# Patient Record
Sex: Female | Born: 2000
Health system: Southern US, Community
[De-identification: ages and names within clinical notes are randomized; demographics above are authoritative.]

## PROBLEM LIST (undated history)

## (undated) ENCOUNTER — Inpatient Hospital Stay (HOSPITAL_COMMUNITY): Payer: Self-pay

## (undated) DIAGNOSIS — D649 Anemia, unspecified: Secondary | ICD-10-CM

## (undated) DIAGNOSIS — E119 Type 2 diabetes mellitus without complications: Secondary | ICD-10-CM

---
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
## 2018-01-05 ENCOUNTER — Emergency Department (HOSPITAL_COMMUNITY)
Admission: EM | Admit: 2018-01-05 | Discharge: 2018-01-05 | Disposition: A | Discharge status: Home / Self Care | Payer: Medicaid Other | Attending: Emergency Medicine | Admitting: Emergency Medicine

## 2018-01-05 ENCOUNTER — Encounter (HOSPITAL_COMMUNITY): Payer: Self-pay | Admitting: *Deleted

## 2018-01-05 ENCOUNTER — Other Ambulatory Visit: Payer: Self-pay

## 2018-01-05 DIAGNOSIS — R1012 Left upper quadrant pain: Secondary | ICD-10-CM | POA: Insufficient documentation

## 2018-01-05 DIAGNOSIS — Z7722 Contact with and (suspected) exposure to environmental tobacco smoke (acute) (chronic): Secondary | ICD-10-CM | POA: Diagnosis not present

## 2018-01-05 DIAGNOSIS — Z79899 Other long term (current) drug therapy: Secondary | ICD-10-CM | POA: Diagnosis not present

## 2018-01-05 DIAGNOSIS — E119 Type 2 diabetes mellitus without complications: Secondary | ICD-10-CM | POA: Insufficient documentation

## 2018-01-05 DIAGNOSIS — Z794 Long term (current) use of insulin: Secondary | ICD-10-CM | POA: Insufficient documentation

## 2018-01-05 LAB — CBC WITH DIFFERENTIAL/PLATELET
Abs Immature Granulocytes: 0 10*3/uL (ref 0.0–0.1)
Basophils Absolute: 0 10*3/uL (ref 0.0–0.1)
Basophils Relative: 0 %
EOS PCT: 3 %
Eosinophils Absolute: 0.2 10*3/uL (ref 0.0–0.7)
HEMATOCRIT: 39 % (ref 36.0–46.0)
HEMOGLOBIN: 12 g/dL (ref 12.0–15.0)
Immature Granulocytes: 0 %
LYMPHS ABS: 2.2 10*3/uL (ref 0.7–4.0)
LYMPHS PCT: 33 %
MCH: 26.1 pg (ref 26.0–34.0)
MCHC: 30.8 g/dL (ref 30.0–36.0)
MCV: 84.8 fL (ref 78.0–100.0)
Monocytes Absolute: 0.5 10*3/uL (ref 0.1–1.0)
Monocytes Relative: 7 %
Neutro Abs: 3.8 10*3/uL (ref 1.7–7.7)
Neutrophils Relative %: 57 %
Platelets: 324 10*3/uL (ref 150–400)
RBC: 4.6 MIL/uL (ref 3.87–5.11)
RDW: 15.6 % — ABNORMAL HIGH (ref 11.5–15.5)
WBC: 6.7 10*3/uL (ref 4.0–10.5)

## 2018-01-05 LAB — COMPREHENSIVE METABOLIC PANEL
ALBUMIN: 3.5 g/dL (ref 3.5–5.0)
ALK PHOS: 61 U/L (ref 38–126)
ALT: 11 U/L (ref 0–44)
AST: 14 U/L — AB (ref 15–41)
Anion gap: 8 (ref 5–15)
BILIRUBIN TOTAL: 0.4 mg/dL (ref 0.3–1.2)
BUN: 10 mg/dL (ref 6–20)
CO2: 24 mmol/L (ref 22–32)
CREATININE: 0.58 mg/dL (ref 0.44–1.00)
Calcium: 9.2 mg/dL (ref 8.9–10.3)
Chloride: 105 mmol/L (ref 98–111)
GFR calc Af Amer: >60 mL/min (ref >60)
GFR calc non Af Amer: >60 mL/min (ref >60)
GLUCOSE: 166 mg/dL — AB (ref 70–99)
Potassium: 3.3 mmol/L — ABNORMAL LOW (ref 3.5–5.1)
Sodium: 137 mmol/L (ref 135–145)
TOTAL PROTEIN: 6.5 g/dL (ref 6.5–8.1)

## 2018-01-05 LAB — LIPASE, BLOOD: Lipase: 35 U/L (ref 11–51)

## 2018-01-05 MED ORDER — GI COCKTAIL ~~LOC~~
30.0000 mL | Freq: Once | ORAL | Status: AC
  Administered 2018-01-05: 30 mL via ORAL
  Filled 2018-01-05: qty 30

## 2018-01-05 MED ORDER — METOCLOPRAMIDE HCL 10 MG PO TABS: 10.0000 mg | ORAL_TABLET | Freq: Three times a day (TID) | ORAL | 0 refills | Status: AC | PRN

## 2018-01-05 MED ORDER — METOCLOPRAMIDE HCL 10 MG PO TABS
10.0000 mg | ORAL_TABLET | Freq: Once | ORAL | Status: AC
  Administered 2018-01-05: 10 mg via ORAL
  Filled 2018-01-05: qty 1

## 2018-01-05 MED ORDER — METOCLOPRAMIDE HCL 5 MG/ML IJ SOLN: 10.0000 mg | Freq: Once | INTRAMUSCULAR | Status: DC

## 2018-01-05 NOTE — ED Triage Notes (Signed)
The pt arrived  By [facility] from home  C/o lt flank pain since august  She delivered in august she has been seen in the [place] for the same.  She was last seen one week ago for the same  Diabetic with an insulin pump  lmp  Delivered in august

## 2018-01-05 NOTE — Discharge Instructions (Signed)

## 2018-01-05 NOTE — ED Provider Notes (Signed)
[facility] EMERGENCY DEPARTMENT Provider Note  CSN: [id number] Arrival date & time: 01/05/18 8657  Chief Complaint(s) Abdominal Pain  HPI Jasmine Baker is a 17 y.o. female   The history is provided by the patient.  Abdominal Pain   Episode onset: 4 weeks. The problem occurs constantly. Progression since onset: fluctuating. The pain is associated with an unknown factor. The pain is located in the LUQ. The quality of the pain is sharp. Pain severity now: moderate to severe. Associated symptoms include nausea, constipation and headaches. Pertinent negatives include anorexia, fever, belching, diarrhea, flatus, hematochezia, melena and vomiting. The symptoms are aggravated by palpation and certain positions. Relieved by: prone position.    Past Medical History Past Medical History:  Diagnosis Date  . Anemia   . Diabetes mellitus without complication [facility])    Patient Active Problem List   Diagnosis Date Noted  . Uncontrolled diabetes mellitus (HCC) 11/11/2017  . Supervision of high risk pregnancy, antepartum 05/12/2017  . Pre-existing type 1 diabetes mellitus during pregnancy in first trimester 05/12/2017  . Hypovitaminosis D 02/06/2017  . Maladaptive health behaviors affecting medical condition 01/29/2017  . 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 Depression   . Non compliance with medical treatment   . Hyperglycemia due to type 1 diabetes mellitus (HCC) 02/12/2016   Home Medication(s) Prior to Admission medications   Medication Sig Start Date End Date Taking? Authorizing Provider  alum & mag hydroxide-simeth (MAALOX ADVANCED MAX ST) 400-400-40 MG/5ML suspension Take 10 mLs by mouth every 6 (six) hours as needed for indigestion. 12/27/17  Yes Dartha Lodge, PA-C  dicyclomine (BENTYL) 20 MG tablet Take 1 tablet (20 mg total) by mouth 2 (two) times daily. 12/27/17  Yes Dartha Lodge, PA-C  famotidine (PEPCID) 20 MG tablet Take 1 tablet (20 mg total) by mouth 2  (two) times daily. 12/27/17  Yes Dartha Lodge, PA-C  ferrous sulfate 325 (65 FE) MG tablet Take 1 tablet (325 mg total) by mouth 2 (two) times daily with a meal. 11/16/17  Yes Montez Morita, CNM  glucagon 1 MG injection Use for Severe Hypoglycemia . Inject 1 mg intramuscularly if unresponsive, unable to swallow, unconscious and/or has seizure 07/30/17  Yes Carlus Pavlov, MD  Insulin Human (INSULIN PUMP) SOLN Inject into the skin continuous. Novolog   Yes [provider]  ondansetron (ZOFRAN ODT) 4 MG disintegrating tablet 4mg  ODT q4 hours prn nausea/vomit Patient taking differently: Take 4 mg by mouth every 4 (four) hours as needed for nausea.  12/27/17  Yes Ford, Arva Chafe, PA-C  ACCU-CHEK FASTCLIX LANCETS MISC 1 each by Does not apply route as directed. Check sugar 6 x daily 04/25/16   Dessa Phi, MD  glucose blood (ACCU-CHEK GUIDE) test strip Use as instructed for 6 checks per day plus per protocol for hyper/hypoglycemia 06/11/17   Hermina Staggers, MD  ibuprofen (ADVIL,MOTRIN) 600 MG tablet Take 1 tablet (600 mg total) by mouth every 6 (six) hours. Patient not taking: Reported on 01/05/2018 11/16/17   Montez Morita, CNM  Insulin Pen Needle (INSUPEN PEN NEEDLES) 32G X 4 MM MISC BD Pen Needles- brand specific. Inject insulin via insulin pen 6 x daily 06/11/17   Hermina Staggers, MD  metoCLOPramide (REGLAN) 10 MG tablet Take 1 tablet (10 mg total) by mouth every 8 (eight) hours as needed for nausea. 01/05/18   Nira Conn, MD  Urine Glucose-Ketones Test STRP Use to check urine in cases of hyperglycemia  05/21/16   Gretchen Short, NP                                                                                                                                    Past Surgical History Past Surgical History:  Procedure Laterality Date  . WISDOM TOOTH EXTRACTION  09/12/2015   Family History Family History  Problem Relation Age of Onset  . Diabetes Mother   . Hypertension  Mother   . Hyperlipidemia Mother   . Kidney disease Mother   . Breast cancer Mother   . Diabetes Maternal Grandfather   . Schizophrenia Brother   . Dementia Maternal Grandmother   . Hypertension Father   . Diabetes Paternal Grandmother     Social History Social History   Tobacco Use  . Smoking status: Passive Smoke Exposure - Never Smoker  . Smokeless tobacco: Never Used  Substance Use Topics  . Alcohol use: No  . Drug use: No   Allergies Patient has no known allergies.  Review of Systems Review of Systems  Constitutional: Negative for fever.  Gastrointestinal: Positive for abdominal pain, constipation and nausea. Negative for anorexia, diarrhea, flatus, hematochezia, melena and vomiting.  Neurological: Positive for headaches.   All other systems are reviewed and are negative for acute change except as noted in the HPI  Physical Exam Vital Signs  I have reviewed the triage vital signs BP (!) 150/93   Pulse (!) 53   Temp 98 F (36.7 C) (Oral)   Resp 20   Ht 5\' 2"  (1.575 m)   Wt 63.5 kg   SpO2 100%   BMI 25.60 kg/m   Physical Exam  Constitutional: She is oriented to person, place, and time. She appears well-developed and well-nourished. No distress.  HENT:  Head: Normocephalic and atraumatic.  Right Ear: External ear normal.  Left Ear: External ear normal.  Nose: Nose normal.  Eyes: Conjunctivae and EOM are normal. No scleral icterus.  Neck: Normal range of motion and phonation normal.  Cardiovascular: Normal rate and regular rhythm.  Pulmonary/Chest: Effort normal. No stridor. No respiratory distress.  Abdominal: She exhibits no distension. There is tenderness in the left upper quadrant. There is no rigidity, no rebound and no guarding.  Musculoskeletal: Normal range of motion. She exhibits no edema.  Neurological: She is alert and oriented to person, place, and time.  Skin: She is not diaphoretic.  Psychiatric: She has a normal mood and affect. Her  behavior is normal.  Vitals reviewed.   ED Results and Treatments Labs (all labs ordered are listed, but only abnormal results are displayed) Labs Reviewed  CBC WITH DIFFERENTIAL/PLATELET - Abnormal; Notable for the following components:      Result Value   RDW 15.6 (*)    All other components within normal limits  COMPREHENSIVE METABOLIC PANEL - Abnormal; Notable for the following components:   Potassium 3.3 (*)    Glucose, Bld 166 (*)  AST 14 (*)    All other components within normal limits  LIPASE, BLOOD                                                                                                                         EKG  EKG Interpretation  Date/Time:    Ventricular Rate:    PR Interval:    QRS Duration:   QT Interval:    QTC Calculation:   R Axis:     Text Interpretation:        Radiology No results found. Pertinent labs & imaging results that were available during my care of the patient were reviewed by me and considered in my medical decision making (see chart for details).  Medications Ordered in ED Medications  gi cocktail (Maalox,Lidocaine,Donnatal) (30 mLs Oral Given 01/05/18 0511)  metoCLOPramide (REGLAN) tablet 10 mg (10 mg Oral Given 01/05/18 0510)                                                                                                                                    Procedures Procedures  (including critical care time)  Medical Decision Making / ED Course I have reviewed the nursing notes for this encounter and the patient's prior records (if available in EHR or on provided paperwork).    Persistent left upper quadrant abdominal pain.  Labs grossly reassuring without leukocytosis or anemia.  No significant electrolyte derangements or renal insufficiency.  No evidence of biliary obstruction or pancreatitis.  Patient given Reglan and GI cocktail, resulting in moderate relief.  She is able to tolerate oral intake.  Low suspicion for  serious intra-abdominal inflammatory/infectious process requiring imaging at this time.  The patient appears reasonably screened and/or stabilized for discharge and I doubt any other medical condition or other [facility] requiring further screening, evaluation, or treatment in the ED at this time prior to discharge.  The patient is safe for discharge with strict return precautions.   Final Clinical Impression(s) / ED Diagnoses Final diagnoses:  LUQ pain    Disposition: Discharge  Condition: Good  I have discussed the results, Dx and Tx plan with the patient who expressed understanding and agree(s) with the plan. Discharge instructions discussed at great length. The patient was given strict return precautions who verbalized understanding of the instructions. No further questions at time of discharge.    ED Discharge Orders  Ordered    metoCLOPramide (REGLAN) 10 MG tablet  Every 8 hours PRN     01/05/18 0733           Follow Up: Primary care provider   If you do not have a primary care physician, contact [facility] at [phone] for referral     This chart was dictated using voice recognition software.  Despite best efforts to proofread,  errors can occur which can change the documentation meaning.   Nira Conn, MD 01/05/18 [phone]

## 2018-01-05 NOTE — ED Notes (Signed)
The pt does not want an iv  edp changed order for po meds

## 2018-10-22 IMAGING — CT CT ABD-PELV W/ CM
2 of 4 series · 16 of 46 positions shown, 18 images · IV contrast (ISOVUE)
Comparison: None.

CLINICAL DATA: Generalized abdominal pain with diarrhea

EXAM:
CT ABDOMEN AND PELVIS WITH CONTRAST
TECHNIQUE: Multidetector CT imaging of the abdomen and pelvis was performed
using the standard protocol following bolus administration of
intravenous contrast.
CONTRAST:  80mL S8WPZ4-2MM IOPAMIDOL (S8WPZ4-2MM) INJECTION 61%

[Series 2: axial st · axial · 0.61mm/px · z∈[-506,-131]mm · 13 of 86 slices shown, 15 images]
[im 6/86  soft-tissue]
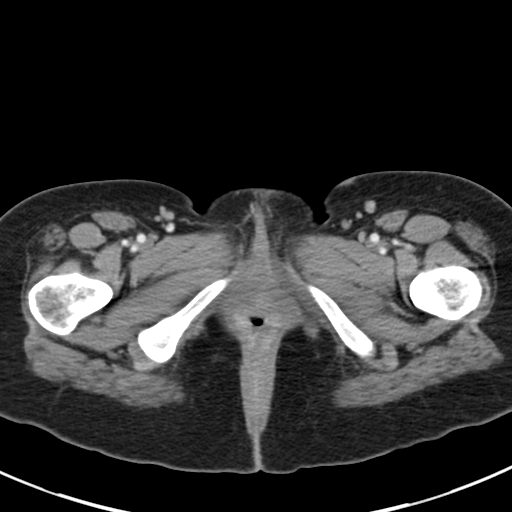
[im 6/86  bone]
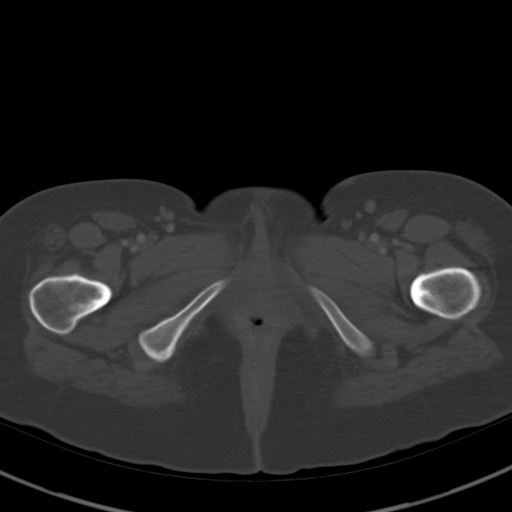
[im 11/86  soft-tissue]
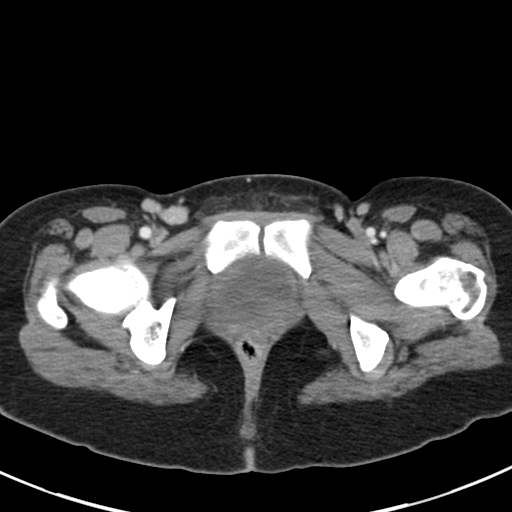
[im 21/86  soft-tissue]
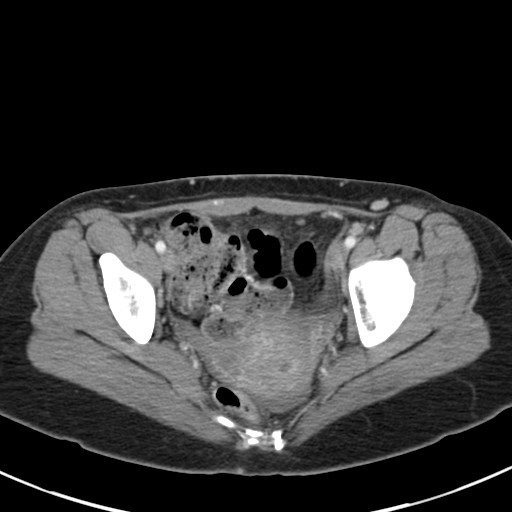
[im 26/86  soft-tissue]
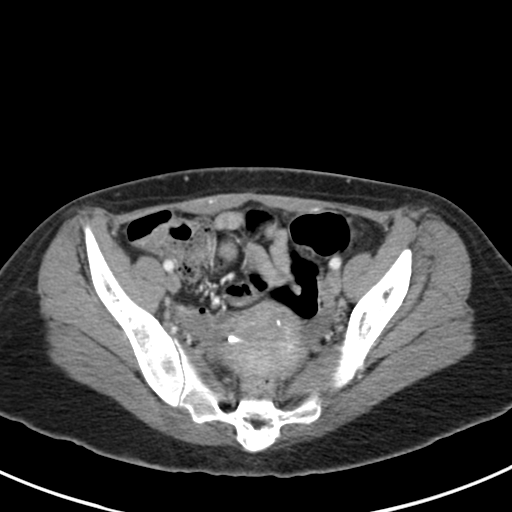
[im 31/86  soft-tissue]
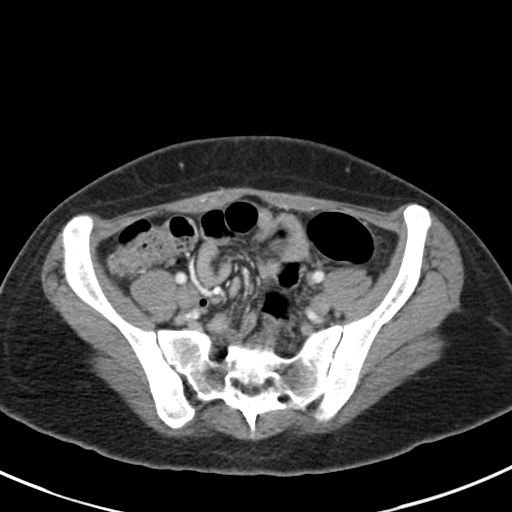
[im 36/86  soft-tissue]
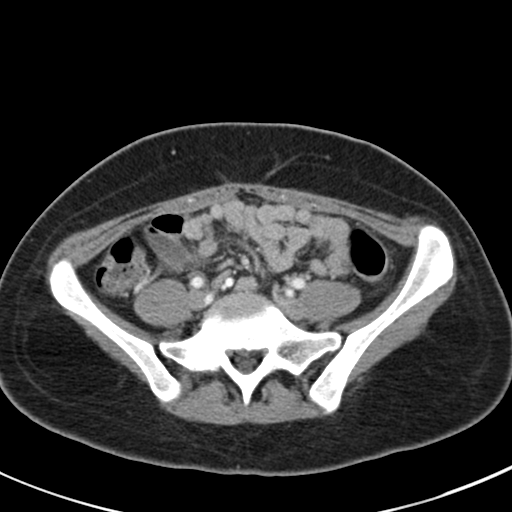
[im 46/86  soft-tissue]
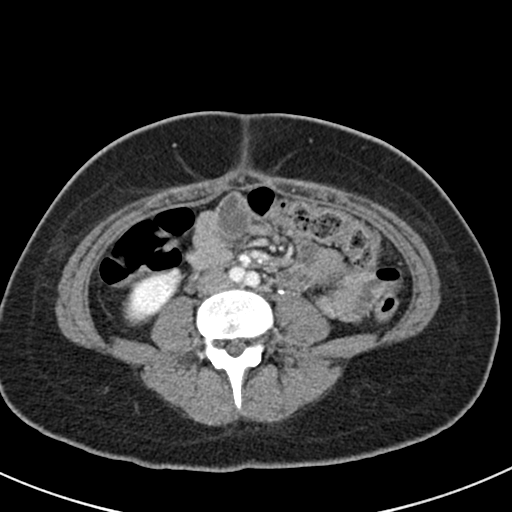
[im 51/86  soft-tissue]
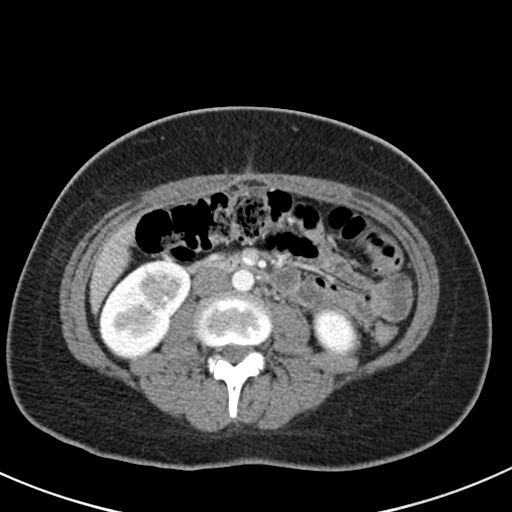
[im 56/86  soft-tissue]
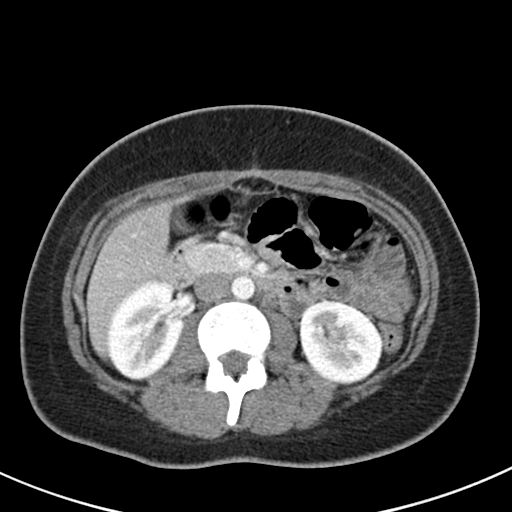
[im 56/86  bone]
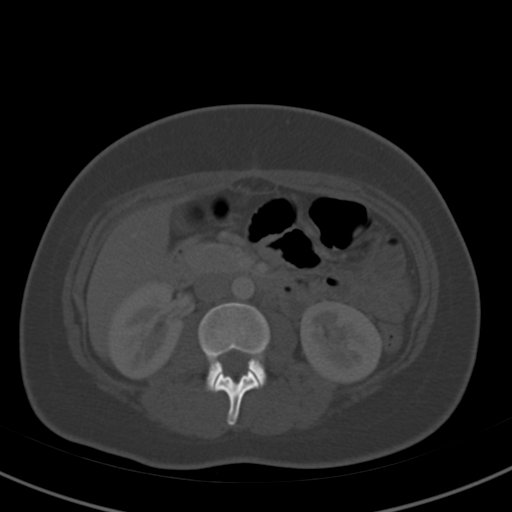
[im 61/86  soft-tissue]
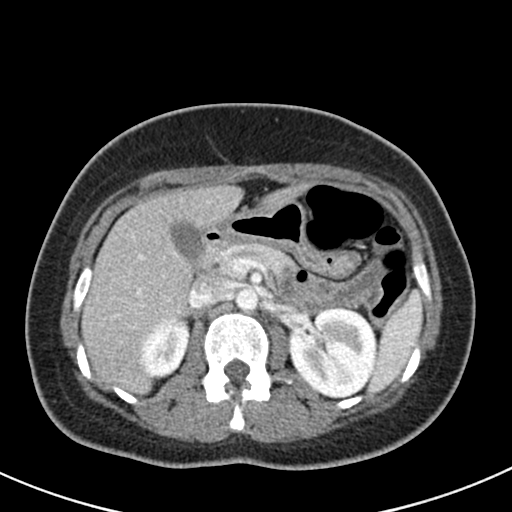
[im 66/86  soft-tissue]
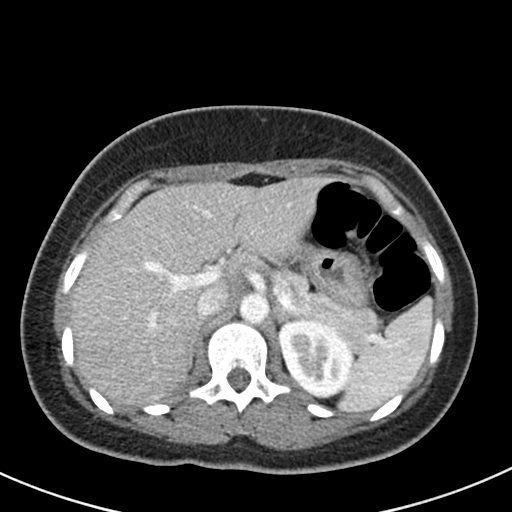
[im 76/86  soft-tissue]
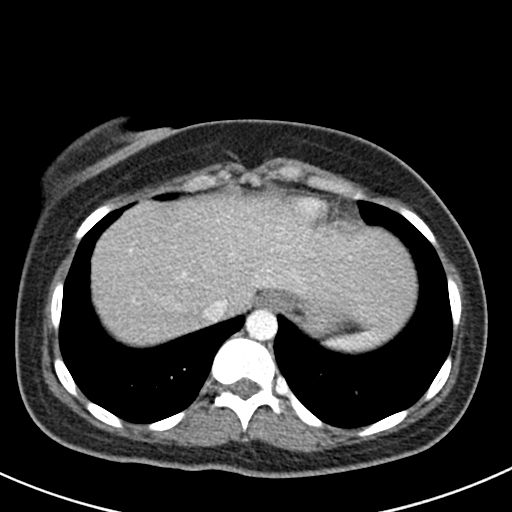
[im 81/86  soft-tissue]
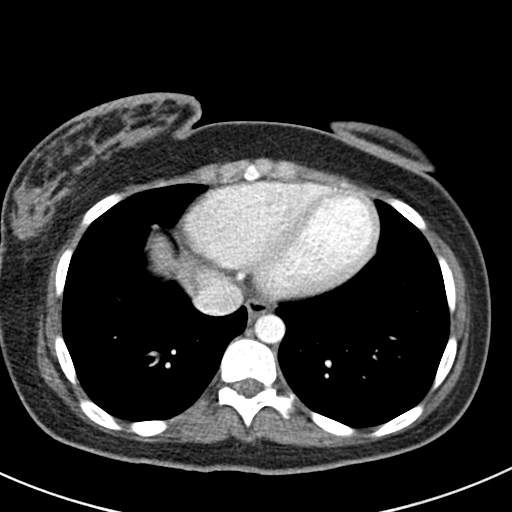

[Series 4: coronal st · coronal · 0.57mm/px · 3 of 65 slices shown]
[im 22/65  soft-tissue]
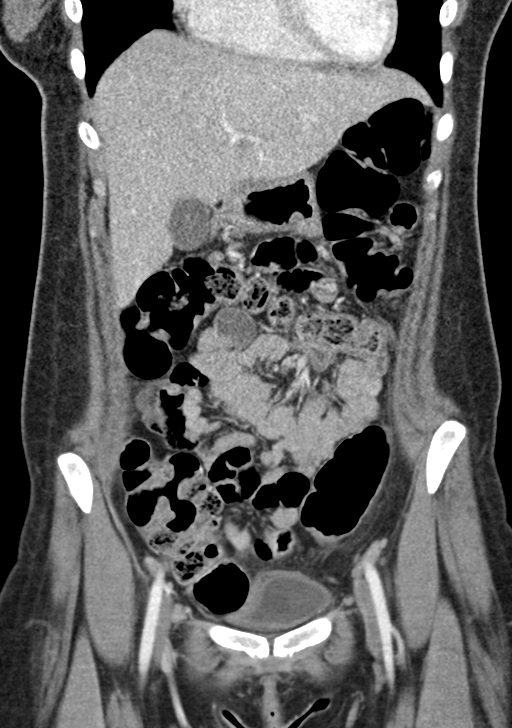
[im 29/65  soft-tissue]
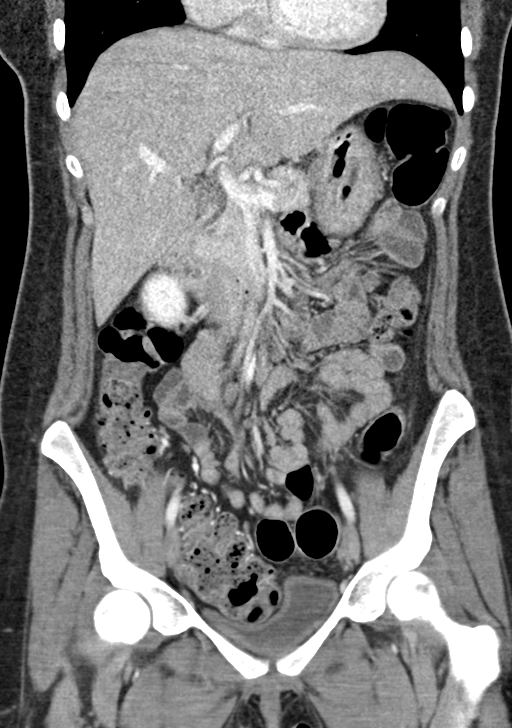
[im 36/65  soft-tissue]
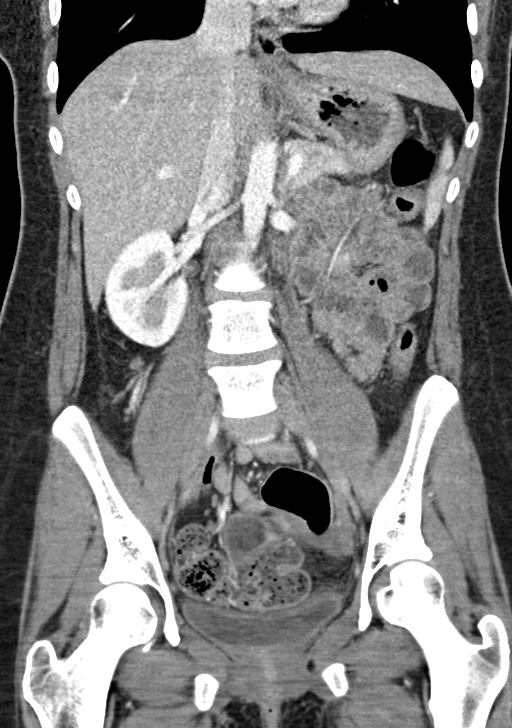

[16 of 46 positions shown; findings below may reference images not displayed]

FINDINGS: Lower chest: No acute abnormality.

Hepatobiliary: No focal liver abnormality is seen. No gallstones,
gallbladder wall thickening, or biliary dilatation.

Pancreas: Unremarkable. No pancreatic ductal dilatation or
surrounding inflammatory changes.

Spleen: Normal in size without focal abnormality.

Adrenals/Urinary Tract: Adrenal glands are unremarkable. Kidneys are
normal, without renal calculi, focal lesion, or hydronephrosis.
Bladder is unremarkable.

Stomach/Bowel: Stomach is within normal limits. Appendix appears
normal. No evidence of bowel wall thickening, distention, or
inflammatory changes.

Vascular/Lymphatic: No significant vascular findings are present. No
enlarged abdominal or pelvic lymph nodes. Prominent right inguinal
lymph nodes measuring up to 11 mm in size.

Reproductive: Intrauterine device in the uterus.  No adnexal mass.

Other: Trace free fluid in the pelvis.  No free air.

Musculoskeletal: No acute or significant osseous findings.
IMPRESSION: 1. No CT evidence for acute intra-abdominal or pelvic abnormality.
2. Trace free fluid in the pelvis.

## 2018-10-22 IMAGING — CR DG KNEE COMPLETE 4+V*R*
4 series · 4 of 4 positions shown · non-contrast
Comparison: None.

CLINICAL DATA: Pain following fall

EXAM:
RIGHT KNEE - COMPLETE 4+ VIEW

[t knee ap right]
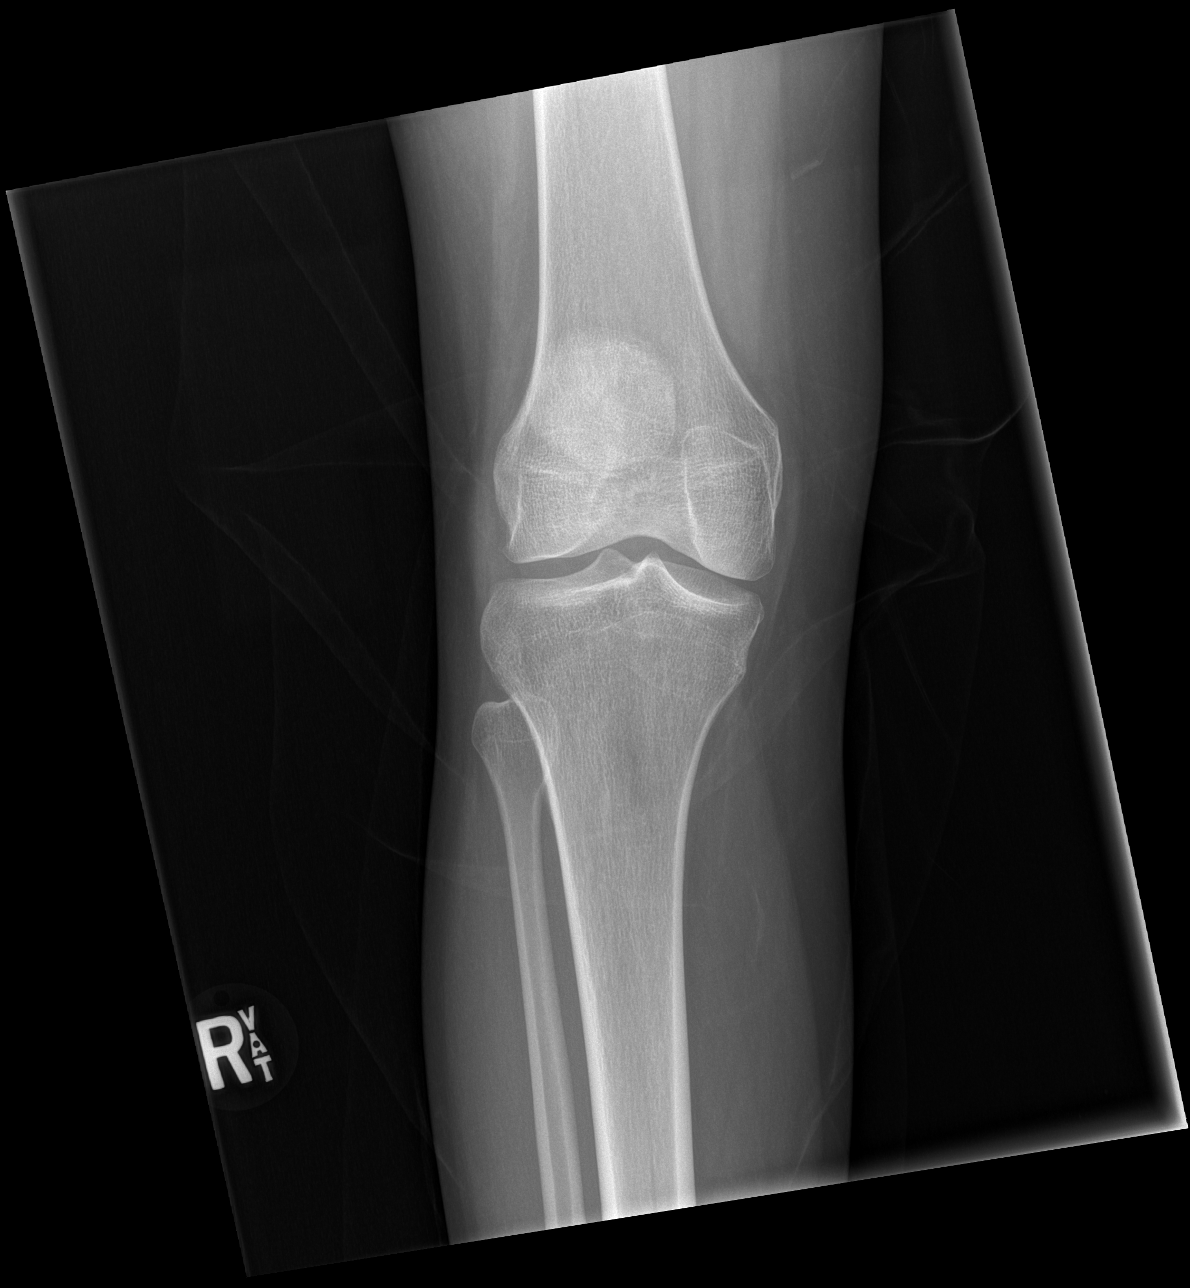

[t knee obl right (1 of 2)]
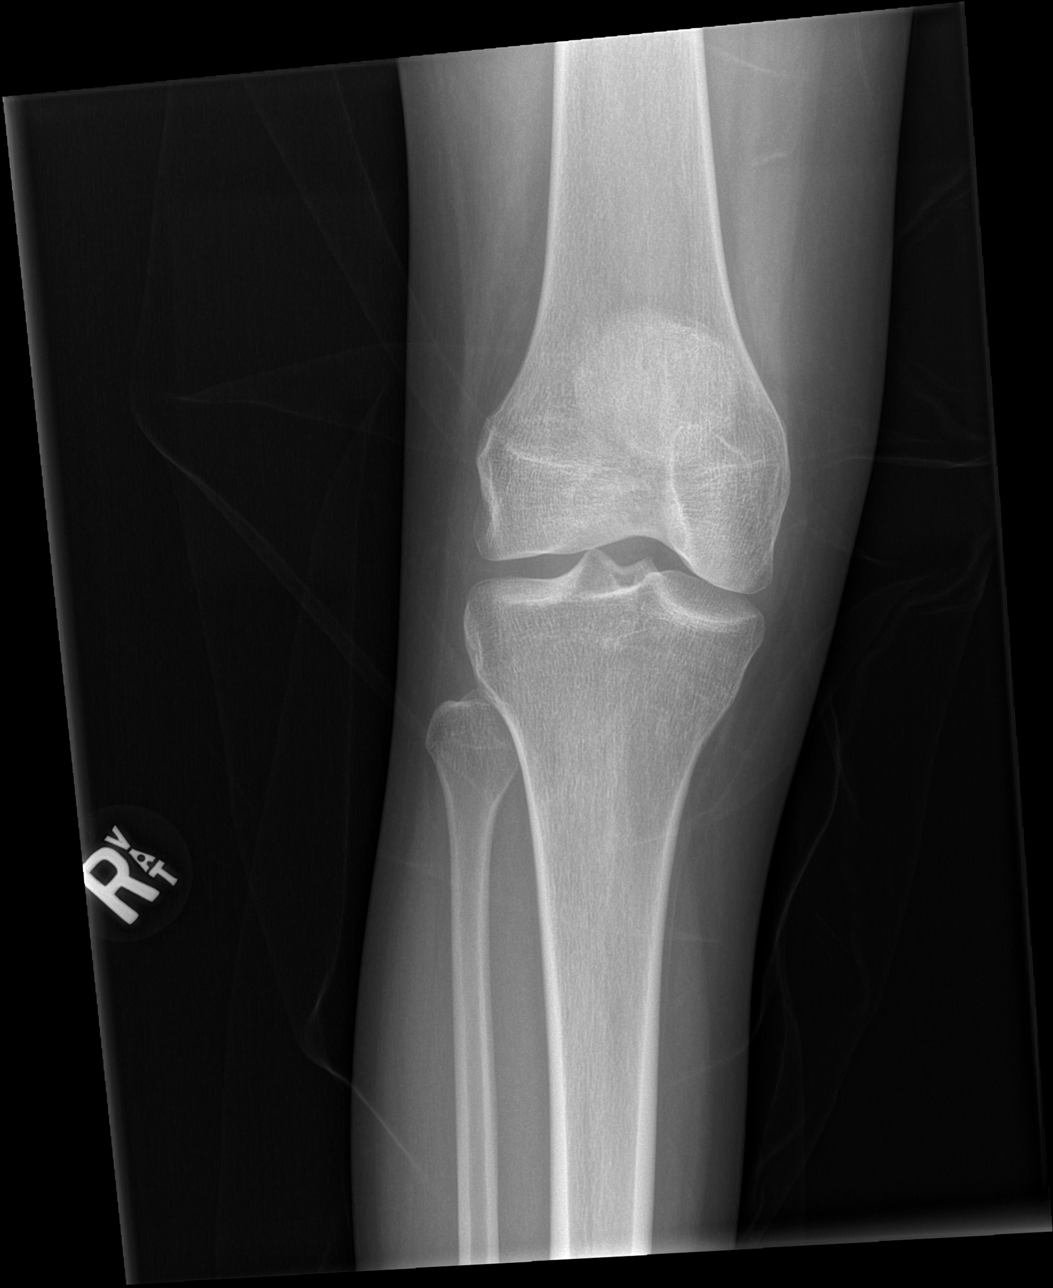

[t knee obl right (2 of 2)]
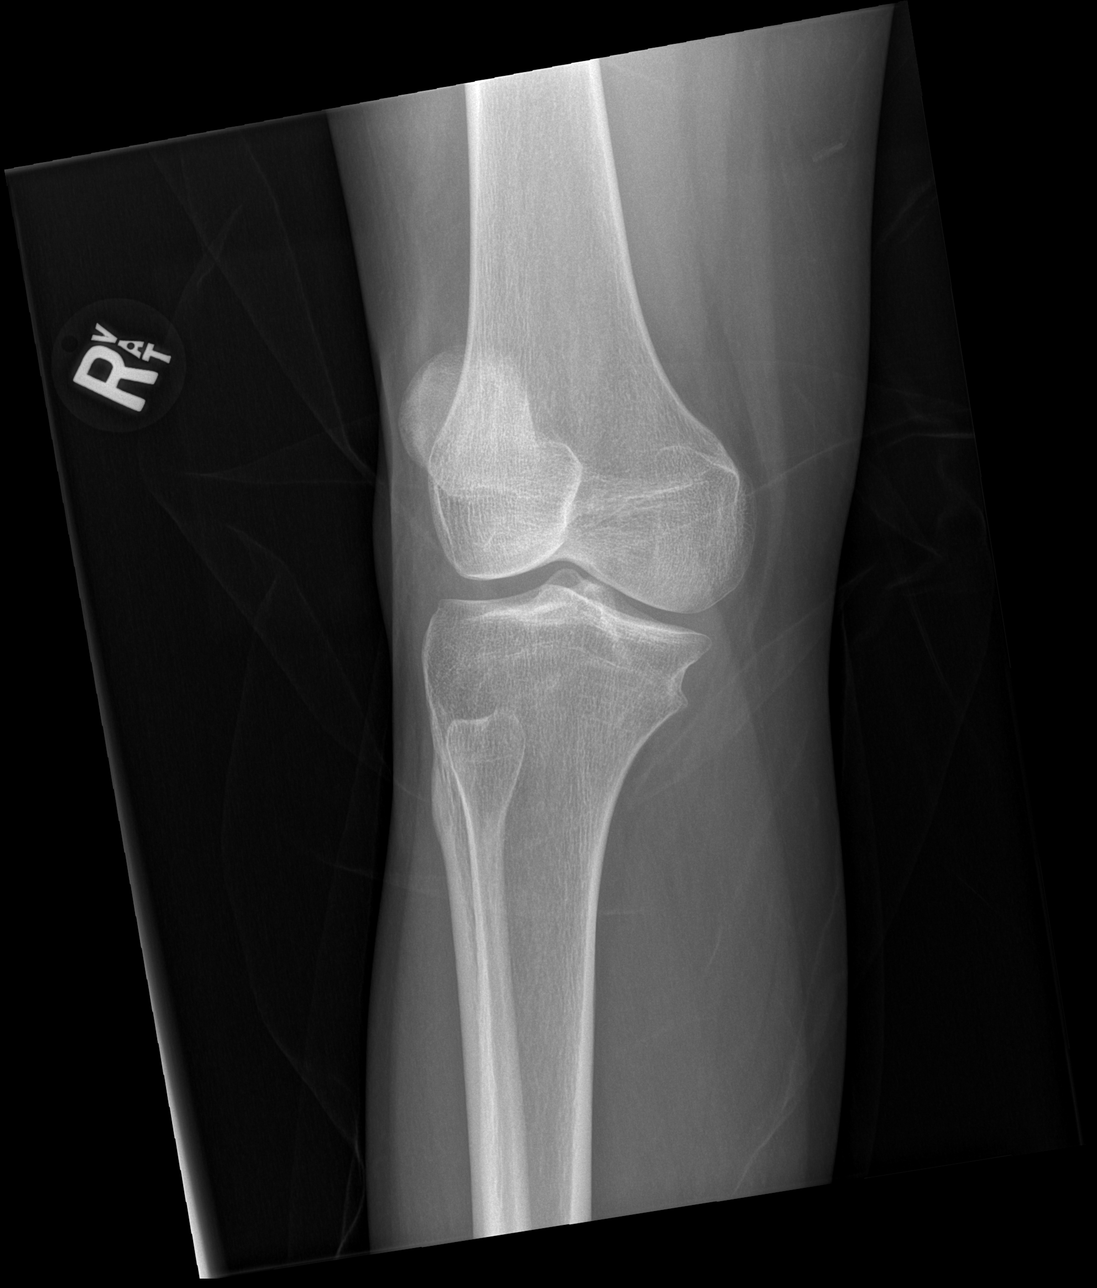

[t knee lat right]
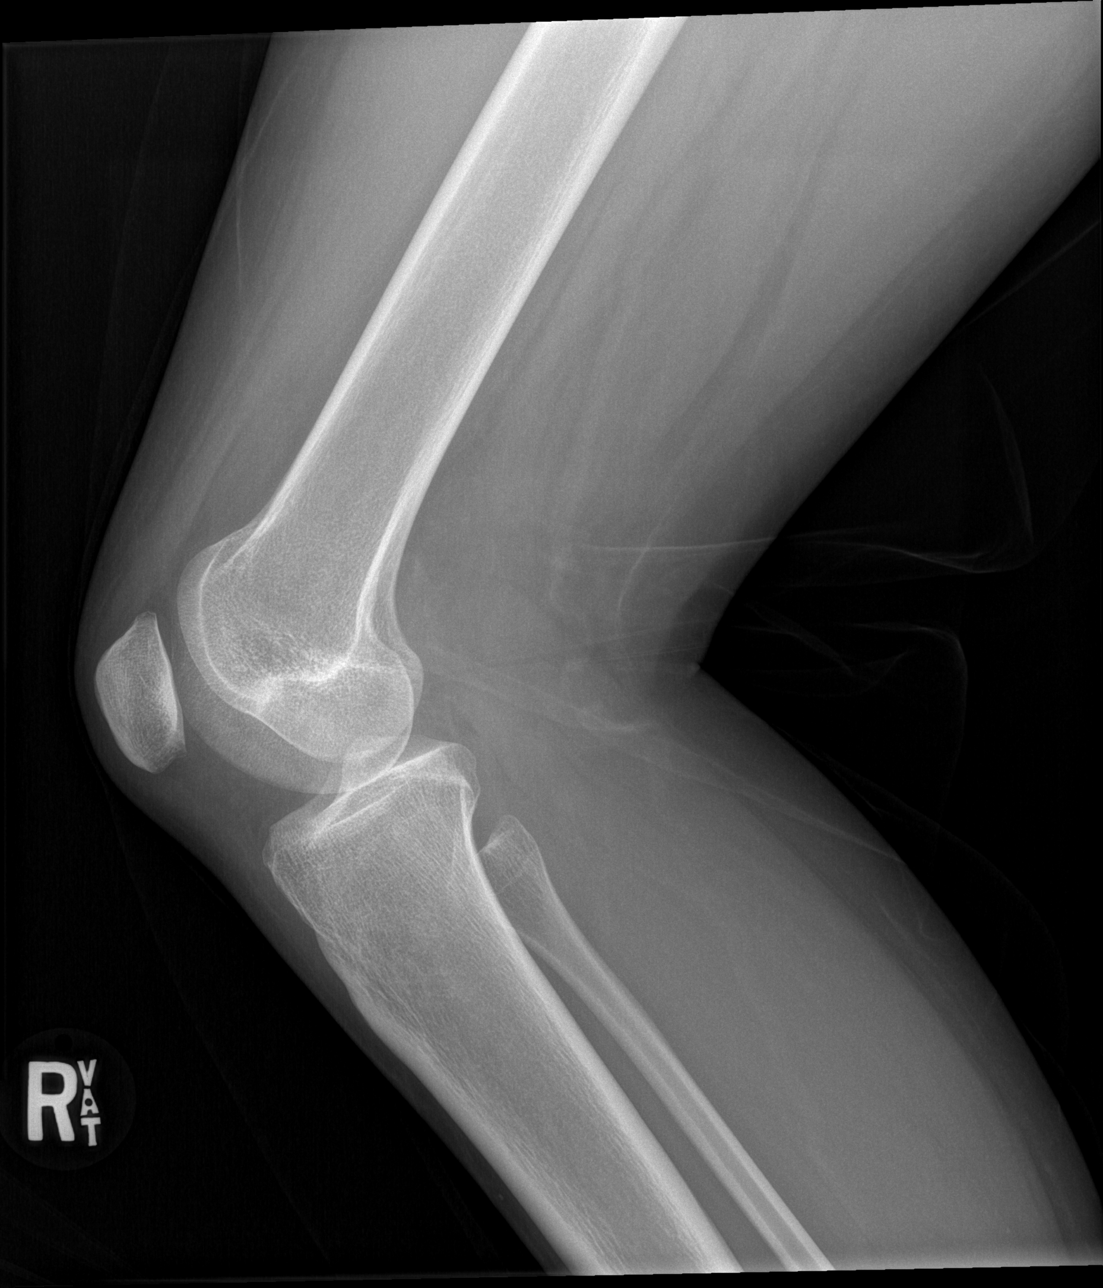

[4 of 4 positions shown; findings below may reference images not displayed]

FINDINGS: Frontal, lateral, and bilateral oblique views were obtained. There
is no fracture or dislocation. No joint effusion. Joint spaces
appear normal. No erosive change.
IMPRESSION: No fracture or dislocation. No joint effusion. No evident
arthropathy.

## 2018-12-29 ENCOUNTER — Emergency Department (HOSPITAL_COMMUNITY)
Admission: EM | Admit: 2018-12-29 | Discharge: 2018-12-29 | Discharge status: Against Medical Advice | Payer: Medicaid Other | Attending: Emergency Medicine | Admitting: Emergency Medicine

## 2018-12-29 ENCOUNTER — Encounter (HOSPITAL_COMMUNITY): Payer: Self-pay | Admitting: Emergency Medicine

## 2018-12-29 ENCOUNTER — Other Ambulatory Visit: Payer: Self-pay

## 2018-12-29 DIAGNOSIS — Z5321 Procedure and treatment not carried out due to patient leaving prior to being seen by health care provider: Secondary | ICD-10-CM | POA: Insufficient documentation

## 2018-12-29 DIAGNOSIS — R109 Unspecified abdominal pain: Secondary | ICD-10-CM | POA: Diagnosis present

## 2018-12-29 NOTE — ED Triage Notes (Signed)
Per [facility] pt from work for sudden abd pains that started today. Had n/v/d couple days ago. Pt thinks could be her IUD.

## 2020-04-15 ENCOUNTER — Emergency Department (HOSPITAL_COMMUNITY): Payer: Medicaid Other

## 2020-04-15 ENCOUNTER — Encounter (HOSPITAL_COMMUNITY): Payer: Self-pay | Admitting: Emergency Medicine

## 2020-04-15 ENCOUNTER — Emergency Department (HOSPITAL_COMMUNITY)
Admission: EM | Admit: 2020-04-15 | Discharge: 2020-04-15 | Disposition: A | Discharge status: Home / Self Care | Payer: Medicaid Other | Attending: Emergency Medicine | Admitting: Emergency Medicine

## 2020-04-15 ENCOUNTER — Other Ambulatory Visit: Payer: Self-pay

## 2020-04-15 DIAGNOSIS — M791 Myalgia, unspecified site: Secondary | ICD-10-CM | POA: Insufficient documentation

## 2020-04-15 DIAGNOSIS — R0789 Other chest pain: Secondary | ICD-10-CM | POA: Insufficient documentation

## 2020-04-15 DIAGNOSIS — Z5321 Procedure and treatment not carried out due to patient leaving prior to being seen by health care provider: Secondary | ICD-10-CM | POA: Diagnosis not present

## 2020-04-15 DIAGNOSIS — R5383 Other fatigue: Secondary | ICD-10-CM | POA: Diagnosis not present

## 2020-04-15 LAB — CBC
HCT: 43.8 % (ref 36.0–46.0)
Hemoglobin: 14.9 g/dL (ref 12.0–15.0)
MCH: 29.7 pg (ref 26.0–34.0)
MCHC: 34 g/dL (ref 30.0–36.0)
MCV: 87.4 fL (ref 80.0–100.0)
Platelets: 230 10*3/uL (ref 150–400)
RBC: 5.01 MIL/uL (ref 3.87–5.11)
RDW: 11.8 % (ref 11.5–15.5)
WBC: 7.6 10*3/uL (ref 4.0–10.5)
nRBC: 0 % (ref 0.0–0.2)

## 2020-04-15 LAB — BASIC METABOLIC PANEL
Anion gap: 13 (ref 5–15)
BUN: 9 mg/dL (ref 6–20)
CO2: 23 mmol/L (ref 22–32)
Calcium: 9.2 mg/dL (ref 8.9–10.3)
Chloride: 99 mmol/L (ref 98–111)
Creatinine, Ser: 0.68 mg/dL (ref 0.44–1.00)
GFR, Estimated: >60 mL/min (ref >60)
Glucose, Bld: 347 mg/dL — ABNORMAL HIGH (ref 70–99)
Potassium: 4 mmol/L (ref 3.5–5.1)
Sodium: 135 mmol/L (ref 135–145)

## 2020-04-15 LAB — I-STAT BETA HCG BLOOD, ED (MC, WL, AP ONLY): I-stat hCG, quantitative: <5 m[IU]/mL (ref <5)

## 2020-04-15 LAB — TROPONIN I (HIGH SENSITIVITY): Troponin I (High Sensitivity): 2 ng/L (ref <18)

## 2020-04-15 NOTE — ED Notes (Signed)
Pt didn't answer when called for vitals  °

## 2020-04-15 NOTE — ED Triage Notes (Signed)
Pt to triage via [facility] from work.  C/o body aches, fatigue, and chest tightness x 7 days.  Recently finished antibiotics for pneumonia.   CBG 393.

## 2021-09-19 ENCOUNTER — Other Ambulatory Visit: Payer: Self-pay

## 2021-09-19 ENCOUNTER — Encounter: Payer: Self-pay | Admitting: Emergency Medicine

## 2021-09-19 ENCOUNTER — Ambulatory Visit
Admission: EM | Admit: 2021-09-19 | Discharge: 2021-09-19 | Disposition: A | Discharge status: Home / Self Care | Payer: Medicaid Other | Attending: Internal Medicine | Admitting: Internal Medicine

## 2021-09-19 DIAGNOSIS — K047 Periapical abscess without sinus: Secondary | ICD-10-CM | POA: Diagnosis not present

## 2021-09-19 DIAGNOSIS — K0889 Other specified disorders of teeth and supporting structures: Secondary | ICD-10-CM

## 2021-09-19 MED ORDER — AMOXICILLIN-POT CLAVULANATE 875-125 MG PO TABS: 1.0000 | ORAL_TABLET | Freq: Two times a day (BID) | ORAL | 0 refills | Status: AC

## 2021-09-19 MED ORDER — IBUPROFEN 600 MG PO TABS: 600.0000 mg | ORAL_TABLET | Freq: Four times a day (QID) | ORAL | 0 refills | Status: AC | PRN

## 2021-09-19 NOTE — ED Provider Notes (Signed)
[facility]    CSN: [id number] Arrival date & time: 09/19/21  1356      History   Chief Complaint Chief Complaint  Patient presents with   Dental Problem    HPI Jasmine Baker is a 21 y.o. female.   Patient presents with right lower dental pain that has been present for 2 days.  Patient reports that she chipped a tooth on a piece of chicken approximately 1 week ago.  Pain and swelling to right lower side of face started a few days prior to this urgent care visit.  Denies fever, body aches, chills, purulent drainage from mouth.  Patient has taken Advil with minimal improvement.     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 Authorizing Provider  amoxicillin-clavulanate (AUGMENTIN) 875-125 MG tablet Take 1 tablet by mouth every 12 (twelve) hours. 09/19/21  Yes Monterrio Gerst, Rolly Salter E, FNP  ibuprofen (ADVIL) 600 MG tablet Take 1 tablet (600 mg total) by mouth every 6 (six) hours as needed for mild pain. 09/19/21  Yes Johnnell Liou, Acie Fredrickson, FNP   ACCU-CHEK FASTCLIX LANCETS MISC 1 each by Does not apply route as directed. Check sugar 6 x daily 04/25/16   Dessa Phi, MD  alum & mag hydroxide-simeth (MAALOX ADVANCED MAX ST) 400-400-40 MG/5ML suspension Take 10 mLs by mouth every 6 (six) hours as needed for indigestion. 12/27/17   Dartha Lodge, PA-C  dicyclomine (BENTYL) 20 MG tablet Take 1 tablet (20 mg total) by mouth 2 (two) times daily. 12/27/17   Dartha Lodge, PA-C  famotidine (PEPCID) 20 MG tablet Take 1 tablet (20 mg total) by mouth 2 (two) times daily. 12/27/17   Dartha Lodge, PA-C  ferrous sulfate 325 (65 FE) MG tablet Take 1 tablet (325 mg total) by mouth 2 (two) times daily with a meal. 11/16/17   Montez Morita, CNM  glucagon 1 MG injection Use for Severe Hypoglycemia . Inject 1 mg intramuscularly if unresponsive, unable to swallow, unconscious and/or has seizure 07/30/17   Carlus Pavlov, MD  glucose blood (ACCU-CHEK GUIDE) test strip Use as instructed for 6 checks per day plus per protocol for hyper/hypoglycemia 06/11/17   Hermina Staggers, MD  Insulin Human (INSULIN PUMP) SOLN Inject into the skin continuous.  Novolog    [provider]  Insulin Pen Needle (INSUPEN PEN NEEDLES) 32G X 4 MM MISC BD Pen Needles- brand specific. Inject insulin via insulin pen 6 x daily 06/11/17   Hermina Staggers, MD  metoCLOPramide (REGLAN) 10 MG tablet Take 1 tablet (10 mg total) by mouth every 8 (eight) hours as needed for nausea. 01/05/18   Nira Conn, MD  ondansetron (ZOFRAN ODT) 4 MG disintegrating tablet 4mg  ODT q4 hours prn nausea/vomit Patient taking differently: Take 4 mg by mouth every 4 (four) hours as needed for nausea.  12/27/17   12/29/17, PA-C  Urine Glucose-Ketones Test STRP Use to check urine in cases of hyperglycemia 05/21/16   05/23/16, NP    Family History Family History  Problem Relation Age of Onset   Diabetes Mother    Hypertension Mother    Hyperlipidemia Mother    Kidney disease  Mother    Breast cancer Mother    Hypertension Father    Schizophrenia Brother    Dementia Maternal Grandmother    Diabetes Maternal Grandfather    Diabetes Paternal Grandmother     Social History Social History   Tobacco Use   Smoking status: Some Days    Types: Cigarettes, Cigars    Passive exposure: Yes   Smokeless tobacco: Never  Vaping Use   Vaping Use: Never used  Substance Use Topics   Alcohol use: Yes   Drug use: Not Currently     Allergies   Patient has no known allergies.   Review of Systems Review of Systems Per HPI  Physical Exam Triage Vital Signs ED Triage Vitals  Enc Vitals Group     BP 09/19/21 1425 120/81     Pulse Rate 09/19/21 1425 79     Resp 09/19/21 1425 16     Temp 09/19/21 1425 98.3 F (36.8 C)     Temp Source 09/19/21 1425 Oral     SpO2 09/19/21 1425 97 %     Weight --      Height --      Head Circumference --      Peak Flow --      Pain Score 09/19/21 1422 9     Pain Loc --      Pain Edu? --      Excl. in GC? --    No data found.  Updated Vital Signs BP 120/81 (BP Location: Left Arm)   Pulse 79   Temp 98.3 F (36.8 C) (Oral)   Resp 16   LMP 08/30/2021   SpO2 97%   Visual Acuity Right Eye Distance:   Left Eye Distance:   Bilateral Distance:    Right Eye Near:   Left Eye Near:    Bilateral Near:     Physical Exam Constitutional:      General: She is not in acute distress.    Appearance: Normal appearance. She is not toxic-appearing or diaphoretic.  HENT:     Head: Normocephalic and atraumatic.     Mouth/Throat:     Dentition: Abnormal dentition. Dental tenderness and gingival swelling present.      Comments: Gingival swelling and erythema located to right lower dentition and surrounding gingiva. Mildly chipped tooth in upper and lower dentition.  Eyes:     Extraocular Movements: Extraocular movements intact.     Conjunctiva/sclera: Conjunctivae normal.  Pulmonary:     Effort: Pulmonary effort is normal.   Neurological:     General: No focal  deficit present.     Mental Status: She is alert and oriented to person, place, and time. Mental status is at baseline.  Psychiatric:        Mood and Affect: Mood normal.        Behavior: Behavior normal.        Thought Content: Thought content normal.        Judgment: Judgment normal.      UC Treatments / Results  Labs (all labs ordered are listed, but only abnormal results are displayed) Labs Reviewed - No data to display  EKG   Radiology No results found.  Procedures Procedures (including critical care time)  Medications Ordered in UC Medications - No data to display  Initial Impression / Assessment and Plan / UC Course  I have reviewed the triage vital signs and the nursing notes.  Pertinent labs & imaging results that were available during my care of the patient were reviewed by me and considered in my medical decision making (see chart for details).     Will treat dental infection and pain with Augmentin antibiotic.  Ibuprofen prescribed to take as needed for pain given recent kidney function appears normal.  Patient advised to not take any additional NSAIDs while taking this.  Unable to do IM Toradol given that patient took Advil prior to coming to urgent care today.  Discussed warm compresses to apply to outside of face as well.  Patient advised to follow-up with dentist for further evaluation and management.  Patient verbalized understanding and was agreeable with plan. Final Clinical Impressions(s) / UC Diagnoses   Final diagnoses:  Dental infection  Pain, dental     Discharge Instructions      You have a dental infection which is being treated with an antibiotic.  Ibuprofen is also prescribed for you to take as needed.  Please do not take any additional ibuprofen, Advil, Aleve while taking this ibuprofen.  Follow-up with dentist as soon as possible for further evaluation and management.    ED Prescriptions      Medication Sig Dispense Auth. Provider   amoxicillin-clavulanate (AUGMENTIN) 875-125 MG tablet Take 1 tablet by mouth every 12 (twelve) hours. 14 tablet [place], P[place] E, [place]   ibuprofen (ADVIL) 600 MG tablet Take 1 tablet (600 mg total) by mouth every 6 (six) hours as needed for mild pain. 30 tablet [place], Acie Fredrickson, [place]      PDMP not reviewed this encounter.   Gustavus Bryant, [place] 09/19/21 [phone]

## 2021-09-19 NOTE — Discharge Instructions (Signed)
You have a dental infection which is being treated with an antibiotic.  Ibuprofen is also prescribed for you to take as needed.  Please do not take any additional ibuprofen, Advil, Aleve while taking this ibuprofen.  Follow-up with dentist as soon as possible for further evaluation and management.

## 2021-09-19 NOTE — ED Triage Notes (Signed)
One week ago chipped a tooth.  Now has pain and swelling on right side of face.  Pain is bottom tooth.  Patient reports she has taken advil , no relief.  Reports a bump
